# Patient Record
Sex: Female | Born: 1985 | Race: Black or African American | Hispanic: No | Marital: Single | Smoking: Never smoker
Health system: Southern US, Community
[De-identification: ages and names within clinical notes are randomized; demographics above are authoritative.]

## PROBLEM LIST (undated history)

## (undated) HISTORY — PX: BREAST BIOPSY: SHX20

---

## 2009-05-02 ENCOUNTER — Emergency Department (HOSPITAL_COMMUNITY): Admission: EM | Admit: 2009-05-02 | Discharge: 2009-05-02 | Payer: Self-pay | Admitting: Emergency Medicine

## 2009-05-05 ENCOUNTER — Emergency Department (HOSPITAL_COMMUNITY): Admission: EM | Admit: 2009-05-05 | Discharge: 2009-05-05 | Payer: Self-pay | Admitting: Emergency Medicine

## 2009-11-11 ENCOUNTER — Ambulatory Visit (HOSPITAL_COMMUNITY): Admission: RE | Admit: 2009-11-11 | Discharge: 2009-11-11 | Payer: Self-pay | Admitting: Obstetrics and Gynecology

## 2010-11-21 LAB — CBC
HCT: 35.3 % — ABNORMAL LOW (ref 36.0–46.0)
Hemoglobin: 11.6 g/dL — ABNORMAL LOW (ref 12.0–15.0)
Platelets: 363 10*3/uL (ref 150–400)
RBC: 4.19 MIL/uL (ref 3.87–5.11)
WBC: 10.4 10*3/uL (ref 4.0–10.5)

## 2010-11-21 LAB — HCG, SERUM, QUALITATIVE: Preg, Serum: NEGATIVE

## 2010-12-02 LAB — POCT PREGNANCY, URINE: Preg Test, Ur: NEGATIVE

## 2010-12-02 LAB — URINALYSIS, ROUTINE W REFLEX MICROSCOPIC
Bilirubin Urine: NEGATIVE
Glucose, UA: NEGATIVE mg/dL
Nitrite: NEGATIVE
Specific Gravity, Urine: 1.027 (ref 1.005–1.030)
pH: 6.5 (ref 5.0–8.0)

## 2010-12-02 LAB — URINE MICROSCOPIC-ADD ON

## 2016-10-06 ENCOUNTER — Other Ambulatory Visit (HOSPITAL_COMMUNITY)
Admission: RE | Admit: 2016-10-06 | Discharge: 2016-10-06 | Disposition: A | Discharge status: Home / Self Care | Payer: 59 | Source: Ambulatory Visit | Attending: Family Medicine | Admitting: Family Medicine

## 2016-10-06 ENCOUNTER — Other Ambulatory Visit: Payer: Self-pay | Admitting: Family Medicine

## 2016-10-06 DIAGNOSIS — Z124 Encounter for screening for malignant neoplasm of cervix: Secondary | ICD-10-CM | POA: Insufficient documentation

## 2016-10-06 DIAGNOSIS — Z113 Encounter for screening for infections with a predominantly sexual mode of transmission: Secondary | ICD-10-CM | POA: Insufficient documentation

## 2016-10-11 LAB — CYTOLOGY - PAP
CHLAMYDIA, DNA PROBE: NEGATIVE
DIAGNOSIS: NEGATIVE
NEISSERIA GONORRHEA: NEGATIVE

## 2018-03-25 ENCOUNTER — Other Ambulatory Visit: Payer: Self-pay | Admitting: Family Medicine

## 2018-03-25 DIAGNOSIS — N6310 Unspecified lump in the right breast, unspecified quadrant: Secondary | ICD-10-CM

## 2018-04-02 ENCOUNTER — Other Ambulatory Visit: Payer: Self-pay | Admitting: Family Medicine

## 2018-04-02 ENCOUNTER — Ambulatory Visit
Admission: RE | Admit: 2018-04-02 | Discharge: 2018-04-02 | Disposition: A | Discharge status: Home / Self Care | Payer: 59 | Source: Ambulatory Visit | Attending: Family Medicine | Admitting: Family Medicine

## 2018-04-02 DIAGNOSIS — N6310 Unspecified lump in the right breast, unspecified quadrant: Secondary | ICD-10-CM

## 2018-10-04 ENCOUNTER — Inpatient Hospital Stay
Admission: RE | Admit: 2018-10-04 | Discharge: 2018-10-04 | Disposition: A | Discharge status: Home / Self Care | Payer: 59 | Source: Ambulatory Visit | Attending: Family Medicine | Admitting: Family Medicine

## 2018-10-18 ENCOUNTER — Other Ambulatory Visit: Payer: Self-pay | Admitting: Family Medicine

## 2018-10-18 ENCOUNTER — Ambulatory Visit
Admission: RE | Admit: 2018-10-18 | Discharge: 2018-10-18 | Disposition: A | Discharge status: Home / Self Care | Payer: 59 | Source: Ambulatory Visit | Attending: Family Medicine | Admitting: Family Medicine

## 2018-10-18 ENCOUNTER — Other Ambulatory Visit: Payer: 59

## 2018-10-18 DIAGNOSIS — N6310 Unspecified lump in the right breast, unspecified quadrant: Secondary | ICD-10-CM

## 2018-10-22 ENCOUNTER — Other Ambulatory Visit: Payer: Self-pay | Admitting: Family Medicine

## 2019-05-02 ENCOUNTER — Other Ambulatory Visit: Payer: 59

## 2019-09-18 IMAGING — US ULTRASOUND RIGHT BREAST LIMITED
1 series · 4 of 4 positions shown · non-contrast
Comparison: Previous exam(s).

CLINICAL DATA: Follow-up for probably benign mass in the RIGHT
breast.

EXAM:
ULTRASOUND OF THE RIGHT BREAST

[Series 1: ultrasound right breast limited · 0.06mm/px · 4 of 4 slices shown]
[im 1/4]
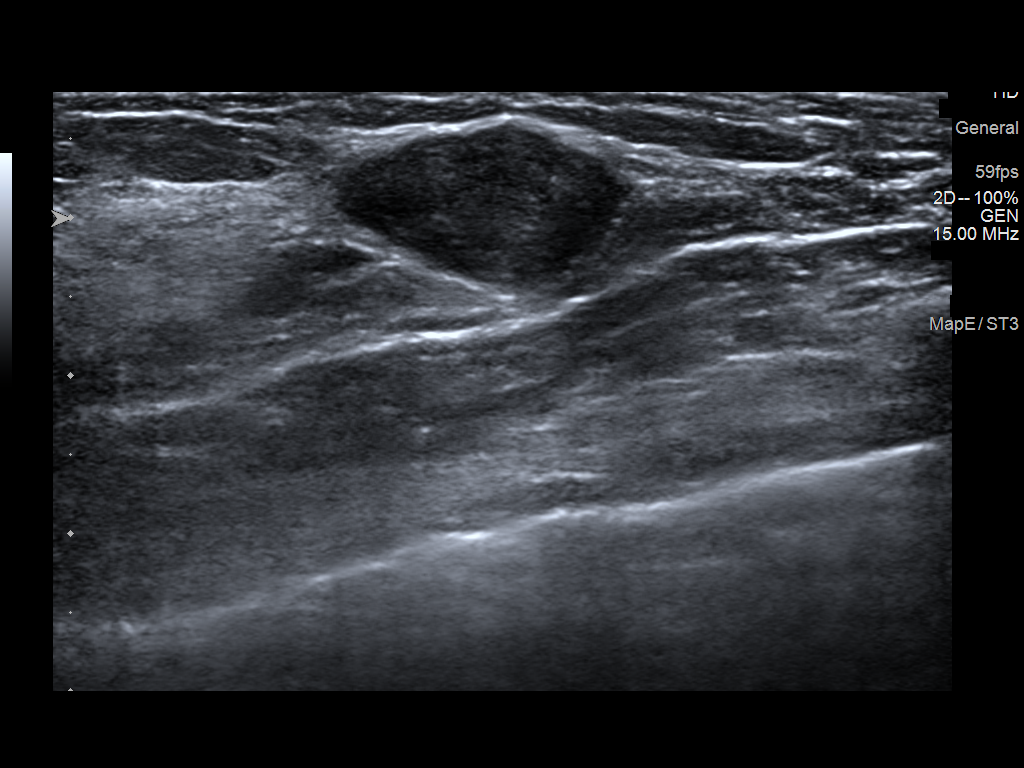
[im 2/4]
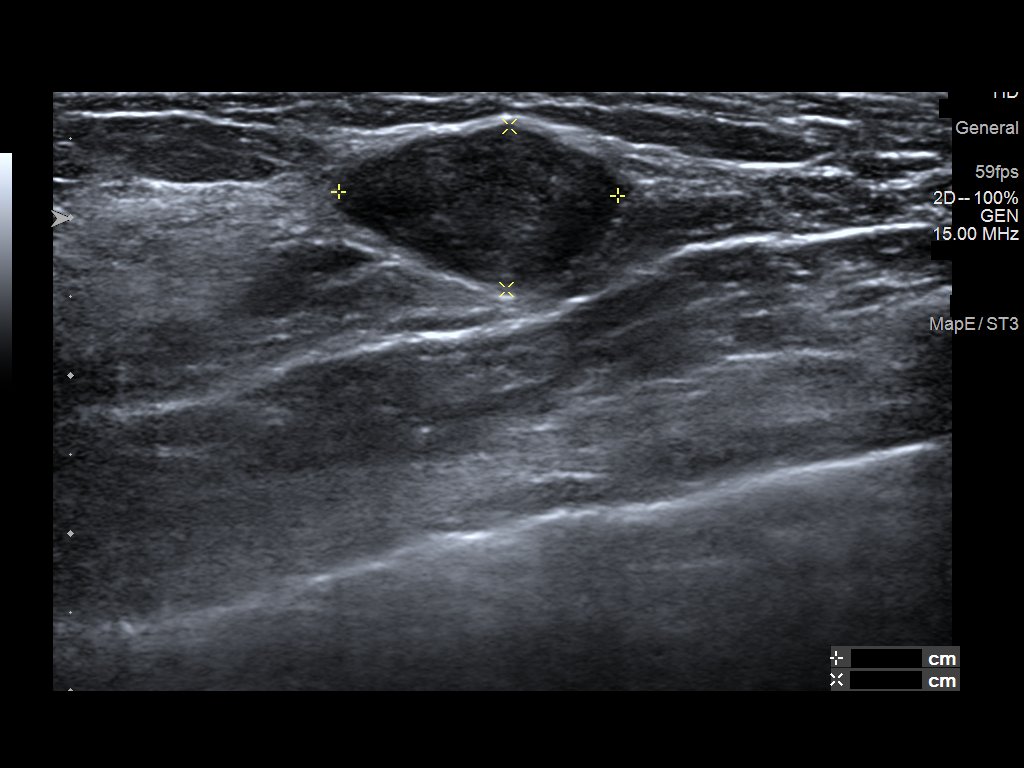
[im 3/4]
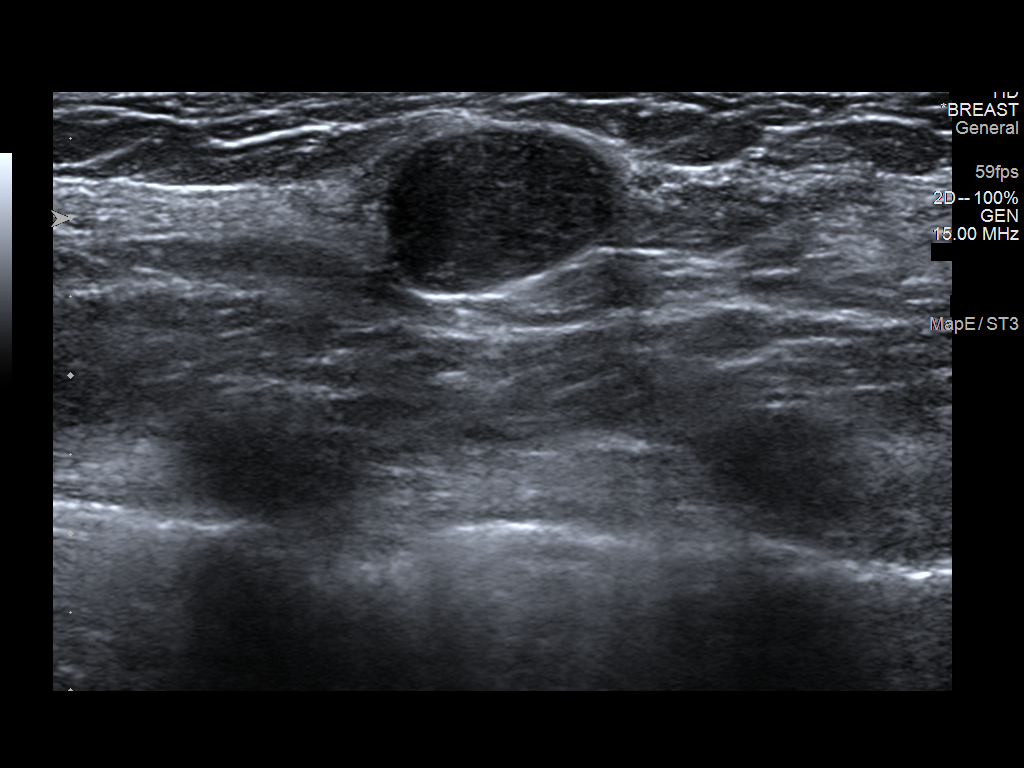
[im 4/4]
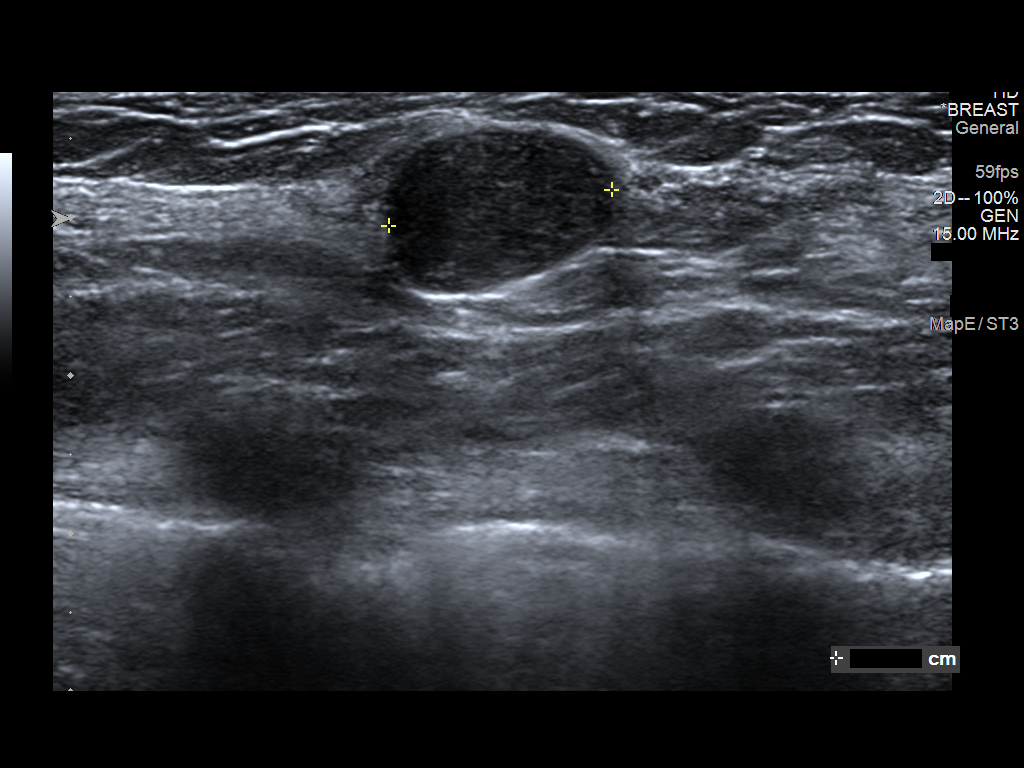

[4 of 4 positions shown; findings below may reference images not displayed]

FINDINGS: Targeted ultrasound is performed, again showing an oval
circumscribed hypoechoic mass in the RIGHT breast at the 2 o'clock
axis, 10 cm from the nipple, measuring 1.8 x 1 x 1.4 cm, not
significantly changed compared to the previous ultrasound of
04/02/2018, again most suggestive of a benign fibroadenoma.
IMPRESSION: Probably benign fibroadenoma in the RIGHT breast at the 2 o'clock
axis, 2 cm from the nipple, measuring 1.8 x 1 x 1.4 cm. Recommend
additional follow-up ultrasound in 6 months to ensure continued
stability.

RECOMMENDATION:
RIGHT breast ultrasound in 6 months.

I have discussed the findings and recommendations with the patient.
Results were also provided in writing at the conclusion of the
visit. If applicable, a reminder letter will be sent to the patient
regarding the next appointment.

BI-RADS CATEGORY  3: Probably benign.

## 2022-05-05 ENCOUNTER — Emergency Department: Payer: No Typology Code available for payment source

## 2022-05-05 ENCOUNTER — Other Ambulatory Visit: Payer: Self-pay

## 2022-05-05 ENCOUNTER — Emergency Department
Admission: EM | Admit: 2022-05-05 | Discharge: 2022-05-05 | Disposition: A | Discharge status: Home / Self Care | Payer: No Typology Code available for payment source | Attending: Emergency Medicine | Admitting: Emergency Medicine

## 2022-05-05 ENCOUNTER — Encounter: Payer: Self-pay | Admitting: Emergency Medicine

## 2022-05-05 DIAGNOSIS — O209 Hemorrhage in early pregnancy, unspecified: Secondary | ICD-10-CM | POA: Insufficient documentation

## 2022-05-05 DIAGNOSIS — N9489 Other specified conditions associated with female genital organs and menstrual cycle: Secondary | ICD-10-CM | POA: Insufficient documentation

## 2022-05-05 DIAGNOSIS — O2391 Unspecified genitourinary tract infection in pregnancy, first trimester: Secondary | ICD-10-CM | POA: Diagnosis not present

## 2022-05-05 DIAGNOSIS — O99891 Other specified diseases and conditions complicating pregnancy: Secondary | ICD-10-CM

## 2022-05-05 DIAGNOSIS — Z3A00 Weeks of gestation of pregnancy not specified: Secondary | ICD-10-CM | POA: Insufficient documentation

## 2022-05-05 DIAGNOSIS — O3680X0 Pregnancy with inconclusive fetal viability, not applicable or unspecified: Secondary | ICD-10-CM

## 2022-05-05 DIAGNOSIS — O200 Threatened abortion: Secondary | ICD-10-CM | POA: Diagnosis not present

## 2022-05-05 LAB — CBC WITH DIFFERENTIAL/PLATELET
Abs Immature Granulocytes: 0.04 10*3/uL (ref 0.00–0.07)
Basophils Absolute: 0 10*3/uL (ref 0.0–0.1)
Basophils Relative: 0 %
Eosinophils Absolute: 0.2 10*3/uL (ref 0.0–0.5)
Eosinophils Relative: 1 %
HCT: 36.5 % (ref 36.0–46.0)
Hemoglobin: 11.9 g/dL — ABNORMAL LOW (ref 12.0–15.0)
Immature Granulocytes: 0 %
Lymphocytes Relative: 25 %
Lymphs Abs: 3.1 10*3/uL (ref 0.7–4.0)
MCH: 27.4 pg (ref 26.0–34.0)
MCHC: 32.6 g/dL (ref 30.0–36.0)
MCV: 83.9 fL (ref 80.0–100.0)
Monocytes Absolute: 0.8 10*3/uL (ref 0.1–1.0)
Monocytes Relative: 6 %
Neutro Abs: 8.2 10*3/uL — ABNORMAL HIGH (ref 1.7–7.7)
Neutrophils Relative %: 68 %
Platelets: 396 10*3/uL (ref 150–400)
RBC: 4.35 MIL/uL (ref 3.87–5.11)
RDW: 13.4 % (ref 11.5–15.5)
WBC: 12.4 10*3/uL — ABNORMAL HIGH (ref 4.0–10.5)
nRBC: 0 % (ref 0.0–0.2)

## 2022-05-05 LAB — BASIC METABOLIC PANEL
Anion gap: 8 (ref 5–15)
BUN: 11 mg/dL (ref 6–20)
CO2: 22 mmol/L (ref 22–32)
Calcium: 8.8 mg/dL — ABNORMAL LOW (ref 8.9–10.3)
Chloride: 105 mmol/L (ref 98–111)
Creatinine, Ser: 0.55 mg/dL (ref 0.44–1.00)
GFR, Estimated: >60 mL/min (ref >60)
Glucose, Bld: 111 mg/dL — ABNORMAL HIGH (ref 70–99)
Potassium: 3.4 mmol/L — ABNORMAL LOW (ref 3.5–5.1)
Sodium: 135 mmol/L (ref 135–145)

## 2022-05-05 LAB — URINALYSIS, ROUTINE W REFLEX MICROSCOPIC
Bilirubin Urine: NEGATIVE
Glucose, UA: NEGATIVE mg/dL
Ketones, ur: 5 mg/dL — AB
Nitrite: NEGATIVE
Protein, ur: 30 mg/dL — AB
RBC / HPF: >50 RBC/hpf — ABNORMAL HIGH (ref 0–5)
Specific Gravity, Urine: 1.003 — ABNORMAL LOW (ref 1.005–1.030)
pH: 6 (ref 5.0–8.0)

## 2022-05-05 LAB — HCG, QUANTITATIVE, PREGNANCY: hCG, Beta Chain, Quant, S: 12959 m[IU]/mL — ABNORMAL HIGH (ref <5)

## 2022-05-05 MED ORDER — CEPHALEXIN 500 MG PO CAPS: 500.0000 mg | ORAL_CAPSULE | Freq: Three times a day (TID) | ORAL | 0 refills | Status: AC

## 2022-05-05 MED ORDER — CEPHALEXIN 500 MG PO CAPS
500.0000 mg | ORAL_CAPSULE | Freq: Once | ORAL | Status: AC
  Administered 2022-05-05: 500 mg via ORAL
  Filled 2022-05-05: qty 1

## 2022-05-05 NOTE — ED Notes (Signed)
Pt verbalized understanding of discharge instructions, prescriptions and follow-up care instructions. Pt advised if symptoms worsen to return to ED.

## 2022-05-05 NOTE — ED Notes (Signed)
Pt reports bleeding and lower abdominal pain reports she thinks she is having a miscarriage

## 2022-05-05 NOTE — ED Notes (Signed)
Called lab to add on CBC and BMP 

## 2022-05-05 NOTE — Discharge Instructions (Addendum)
Follow-up with your OB provider on Monday, as scheduled. You will need your blood hormone levels checked every 2 days. You should follow-up with your provider for repeat ultrasound in 7-10 days. Take the antibiotic as directed.

## 2022-05-05 NOTE — ED Triage Notes (Signed)
Pt in via POV, reports recent positive pregnancy test, possibly 8-[redacted] weeks pregnant.  Began with some spotting yesterday, progressing to worsening bleeding today w/ severe cramps.  Patient ambulatory to triage, NAD noted at this time.

## 2022-05-05 NOTE — ED Provider Notes (Signed)
[facility] Emergency Department Provider Note     Event Date/Time   First MD Initiated Contact with Patient 05/05/22 2129     (approximate)   History   Threatened Miscarriage   HPI  Abigail Owens is a 36 y.o. G1P0 female presents to the ED for evaluation of brownish vaginal discharge, yesterday. She then noted bright red blood in the toilet. She began to experience a heavier flow of dark to bright red blood, noting enough to stain a pantyliner. She denies dysuria, and minimal pelvic cramping.    Physical Exam   Triage Vital Signs: ED Triage Vitals  Enc Vitals Group     BP 05/05/22 1927 135/71     Pulse Rate 05/05/22 1927 75     Resp 05/05/22 1927 20     Temp 05/05/22 1927 98.6 F (37 C)     Temp src --      SpO2 05/05/22 1927 100 %     Weight 05/05/22 1931 145 lb (65.8 kg)     Height 05/05/22 1931 5\' 4"  (1.626 m)     Head Circumference --      Peak Flow --      Pain Score 05/05/22 1930 6     Pain Loc --      Pain Edu? --      Excl. in GC? --     Most recent vital signs: Vitals:   05/05/22 1927 05/05/22 2336  BP: 135/71 95/72  Pulse: 75 89  Resp: 20 19  Temp: 98.6 F (37 C) 99.3 F (37.4 C)  SpO2: 100% 100%    General Awake, no distress.  CV:  Good peripheral perfusion.  RESP:  Normal effort.  ABD:  No distention.  GU:  deferred   ED Results / Procedures / Treatments   Labs (all labs ordered are listed, but only abnormal results are displayed) Labs Reviewed  HCG, QUANTITATIVE, PREGNANCY - Abnormal; Notable for the following components:      Result Value   hCG, Beta Chain, Quant, S 12,959 (*)    All other components within normal limits  BASIC METABOLIC PANEL - Abnormal; Notable for the following components:   Potassium 3.4 (*)    Glucose, Bld 111 (*)    Calcium 8.8 (*)    All other components within normal limits  CBC WITH DIFFERENTIAL/PLATELET - Abnormal; Notable for the following components:   WBC 12.4 (*)     Hemoglobin 11.9 (*)    Neutro Abs 8.2 (*)    All other components within normal limits  URINALYSIS, ROUTINE W REFLEX MICROSCOPIC - Abnormal; Notable for the following components:   Color, Urine YELLOW (*)    APPearance CLEAR (*)    Specific Gravity, Urine 1.003 (*)    Hgb urine dipstick LARGE (*)    Ketones, ur 5 (*)    Protein, ur 30 (*)    Leukocytes,Ua TRACE (*)    RBC / HPF >50 (*)    Bacteria, UA RARE (*)    All other components within normal limits     EKG   RADIOLOGY  I personally viewed and evaluated these images as part of my medical decision making, as well as reviewing the written report by the radiologist.  ED Provider Interpretation: no IUP identified  07/05/22 OB Less Than 14 weeks  IMPRESSION: No intrauterine pregnancy identified and no suspicious adnexal masses noted. Findings consistent with pregnancy of unknown location and differential diagnosis includes: An early IUP,  recent spontaneous miscarriage, or an occult ectopic pregnancy. Clinical correlation and follow-up with serial HCG levels and repeat ultrasound in 7-11 days, or earlier if clinically indicated, recommended.    PROCEDURES:  Critical Care performed: No  Procedures   MEDICATIONS ORDERED IN ED: Medications  cephALEXin (KEFLEX) capsule 500 mg (500 mg Oral Given 05/05/22 2259)     IMPRESSION / MDM / ASSESSMENT AND PLAN / ED COURSE  I reviewed the triage vital signs and the nursing notes.                              Differential diagnosis includes, but is not limited to, threatened miscarriage, incomplete miscarriage, normal bleeding from an early trimester pregnancy, ectopic pregnancy, , blighted ovum, vaginal/cervical trauma, subchorionic hemorrhage/hematoma, etc.   Patient's presentation is most consistent with acute complicated illness / injury requiring diagnostic workup.  Patient's diagnosis is consistent with bleeding in pregnancy, with unknown anatomic position of said  pregnancy.  Patient also with bacteriuria since UA is recent.  Patient will be discharged home with prescriptions for Keflex. Patient is to follow up with her OB provider for serial hCG quant and 2 to 3 days as well as repeat ultrasound to evaluate for possible ectopic versus spontaneous miscarriage. Patient is given ED precautions to return to the ED for any worsening or new symptoms.     FINAL CLINICAL IMPRESSION(S) / ED DIAGNOSES   Final diagnoses:  Vaginal bleeding in pregnancy, first trimester  Bacteriuria in pregnancy  Pregnancy of unknown anatomic location     Rx / DC Orders   ED Discharge Orders          Ordered    cephALEXin (KEFLEX) 500 MG capsule  3 times daily        05/05/22 2320             Note:  This document was prepared using Dragon voice recognition software and may include unintentional dictation errors.    Lissa Hoard, PA-C 05/07/22 Elesa Hacker, MD 05/09/22 [phone]

## 2022-05-05 NOTE — ED Notes (Addendum)
E-signature not available due to signature pad not working.  

## 2023-06-04 LAB — PANORAMA PRENATAL TEST FULL PANEL:PANORAMA TEST PLUS 5 ADDITIONAL MICRODELETIONS: FETAL FRACTION: 10

## 2024-05-27 ENCOUNTER — Encounter: Payer: Self-pay | Admitting: Obstetrics and Gynecology

## 2024-05-27 DIAGNOSIS — N6312 Unspecified lump in the right breast, upper inner quadrant: Secondary | ICD-10-CM

## 2024-05-28 ENCOUNTER — Other Ambulatory Visit: Payer: Self-pay | Admitting: Obstetrics and Gynecology

## 2024-05-28 DIAGNOSIS — N6312 Unspecified lump in the right breast, upper inner quadrant: Secondary | ICD-10-CM

## 2024-06-13 ENCOUNTER — Ambulatory Visit
Admission: RE | Admit: 2024-06-13 | Discharge: 2024-06-13 | Disposition: A | Source: Ambulatory Visit | Attending: Obstetrics and Gynecology

## 2024-06-13 ENCOUNTER — Encounter: Payer: Self-pay | Admitting: Obstetrics and Gynecology

## 2024-06-13 DIAGNOSIS — N6312 Unspecified lump in the right breast, upper inner quadrant: Secondary | ICD-10-CM

## 2024-06-16 ENCOUNTER — Other Ambulatory Visit: Payer: Self-pay | Admitting: Obstetrics and Gynecology

## 2024-06-16 DIAGNOSIS — N6310 Unspecified lump in the right breast, unspecified quadrant: Secondary | ICD-10-CM

## 2024-06-20 ENCOUNTER — Ambulatory Visit
Admission: RE | Admit: 2024-06-20 | Discharge: 2024-06-20 | Disposition: A | Discharge status: Home / Self Care | Source: Ambulatory Visit | Attending: Obstetrics and Gynecology | Admitting: Obstetrics and Gynecology

## 2024-06-20 DIAGNOSIS — N6310 Unspecified lump in the right breast, unspecified quadrant: Secondary | ICD-10-CM

## 2024-06-20 HISTORY — PX: BREAST BIOPSY: SHX20

## 2024-06-23 LAB — SURGICAL PATHOLOGY
